# Patient Record
Sex: Female | Born: 1975 | Race: White | Hispanic: No | Marital: Single | Smoking: Never smoker
Health system: Southern US, Community
[De-identification: ages and names within clinical notes are randomized; demographics above are authoritative.]

## PROBLEM LIST (undated history)

## (undated) DIAGNOSIS — G43909 Migraine, unspecified, not intractable, without status migrainosus: Secondary | ICD-10-CM

## (undated) DIAGNOSIS — T8859XA Other complications of anesthesia, initial encounter: Secondary | ICD-10-CM

## (undated) DIAGNOSIS — T4145XA Adverse effect of unspecified anesthetic, initial encounter: Secondary | ICD-10-CM

## (undated) DIAGNOSIS — S62309A Unspecified fracture of unspecified metacarpal bone, initial encounter for closed fracture: Secondary | ICD-10-CM

## (undated) DIAGNOSIS — J45909 Unspecified asthma, uncomplicated: Secondary | ICD-10-CM

## (undated) DIAGNOSIS — Z8541 Personal history of malignant neoplasm of cervix uteri: Secondary | ICD-10-CM

## (undated) DIAGNOSIS — M1990 Unspecified osteoarthritis, unspecified site: Secondary | ICD-10-CM

## (undated) DIAGNOSIS — M26629 Arthralgia of temporomandibular joint, unspecified side: Secondary | ICD-10-CM

## (undated) DIAGNOSIS — F419 Anxiety disorder, unspecified: Secondary | ICD-10-CM

## (undated) HISTORY — PX: CERVICAL CONE BIOPSY: SUR198

## (undated) HISTORY — PX: DILATION AND CURETTAGE OF UTERUS: SHX78

## (undated) HISTORY — PX: DIAGNOSTIC LAPAROSCOPY: SUR761

## (undated) HISTORY — PX: OVARIAN CYST REMOVAL: SHX89

## (undated) HISTORY — PX: WISDOM TOOTH EXTRACTION: SHX21

---

## 1997-02-28 ENCOUNTER — Inpatient Hospital Stay (HOSPITAL_COMMUNITY): Admission: AD | Admit: 1997-02-28 | Discharge: 1997-02-28 | Payer: Self-pay | Admitting: *Deleted

## 1997-09-17 ENCOUNTER — Inpatient Hospital Stay (HOSPITAL_COMMUNITY): Admission: AD | Admit: 1997-09-17 | Discharge: 1997-09-19 | Payer: Self-pay | Admitting: *Deleted

## 2001-10-19 ENCOUNTER — Emergency Department (HOSPITAL_COMMUNITY): Admission: EM | Admit: 2001-10-19 | Discharge: 2001-10-19 | Payer: Self-pay | Admitting: Emergency Medicine

## 2001-10-19 ENCOUNTER — Encounter: Payer: Self-pay | Admitting: Emergency Medicine

## 2003-10-23 ENCOUNTER — Other Ambulatory Visit: Admission: RE | Admit: 2003-10-23 | Discharge: 2003-10-23 | Payer: Self-pay | Admitting: Obstetrics and Gynecology

## 2003-11-24 ENCOUNTER — Inpatient Hospital Stay (HOSPITAL_COMMUNITY): Admission: AD | Admit: 2003-11-24 | Discharge: 2003-11-25 | Payer: Self-pay | Admitting: Obstetrics and Gynecology

## 2003-11-25 ENCOUNTER — Ambulatory Visit (HOSPITAL_COMMUNITY): Admission: RE | Admit: 2003-11-25 | Discharge: 2003-11-25 | Payer: Self-pay | Admitting: Obstetrics and Gynecology

## 2004-01-13 ENCOUNTER — Inpatient Hospital Stay (HOSPITAL_COMMUNITY): Admission: AD | Admit: 2004-01-13 | Discharge: 2004-01-13 | Payer: Self-pay | Admitting: Obstetrics and Gynecology

## 2004-02-17 ENCOUNTER — Inpatient Hospital Stay (HOSPITAL_COMMUNITY): Admission: AD | Admit: 2004-02-17 | Discharge: 2004-02-17 | Payer: Self-pay | Admitting: Obstetrics and Gynecology

## 2004-04-14 ENCOUNTER — Inpatient Hospital Stay (HOSPITAL_COMMUNITY): Admission: AD | Admit: 2004-04-14 | Discharge: 2004-04-14 | Payer: Self-pay | Admitting: Obstetrics and Gynecology

## 2004-04-17 ENCOUNTER — Inpatient Hospital Stay (HOSPITAL_COMMUNITY): Admission: AD | Admit: 2004-04-17 | Discharge: 2004-04-17 | Payer: Self-pay | Admitting: Obstetrics and Gynecology

## 2004-04-18 ENCOUNTER — Inpatient Hospital Stay (HOSPITAL_COMMUNITY): Admission: AD | Admit: 2004-04-18 | Discharge: 2004-04-21 | Payer: Self-pay | Admitting: Obstetrics and Gynecology

## 2004-04-19 HISTORY — PX: TUBAL LIGATION: SHX77

## 2004-11-04 ENCOUNTER — Other Ambulatory Visit: Admission: RE | Admit: 2004-11-04 | Discharge: 2004-11-04 | Payer: Self-pay | Admitting: Obstetrics and Gynecology

## 2005-11-22 ENCOUNTER — Other Ambulatory Visit: Admission: RE | Admit: 2005-11-22 | Discharge: 2005-11-22 | Payer: Self-pay | Admitting: Obstetrics and Gynecology

## 2006-02-02 ENCOUNTER — Emergency Department (HOSPITAL_COMMUNITY): Admission: EM | Admit: 2006-02-02 | Discharge: 2006-02-02 | Payer: Self-pay | Admitting: Family Medicine

## 2007-10-13 ENCOUNTER — Emergency Department (HOSPITAL_COMMUNITY): Admission: EM | Admit: 2007-10-13 | Discharge: 2007-10-13 | Payer: Self-pay | Admitting: Emergency Medicine

## 2008-06-01 ENCOUNTER — Emergency Department (HOSPITAL_COMMUNITY): Admission: EM | Admit: 2008-06-01 | Discharge: 2008-06-01 | Payer: Self-pay | Admitting: Family Medicine

## 2009-04-06 ENCOUNTER — Emergency Department (HOSPITAL_COMMUNITY)
Admission: EM | Admit: 2009-04-06 | Discharge: 2009-04-06 | Payer: Self-pay | Source: Home / Self Care | Admitting: Family Medicine

## 2010-05-21 NOTE — Op Note (Signed)
Mackenzie Higgins, Mackenzie Higgins               ACCOUNT NO.:  [id number]   MEDICAL RECORD NO.:  [id number]          PATIENT TYPE:  INP   LOCATION:  9147                          FACILITY:  [facility]   PHYSICIAN:  Crist Fat. Rivard, M.D. DATE OF BIRTH:  1975/05/13   DATE OF PROCEDURE:  04/19/2004  DATE OF DISCHARGE:                                 OPERATIVE REPORT   PREOPERATIVE DIAGNOSIS:  Desire for sterilization.   POSTOPERATIVE DIAGNOSIS:  Desire for sterilization.   ANESTHESIA:  Epidural.   PROCEDURE:  Postpartum bilateral tubal ligation.   SURGEON:  Crist Fat. Rivard, M.D.   ESTIMATED BLOOD LOSS:  Minimal.   PROCEDURE:  After being informed of the planned procedure with possible  complications including bleeding, infection, injury to other organs,  irreversibility of tubal ligation as well as failure rate of one in 1000,  informed consent was obtained.  The patient is taken to OR #4 and pre-  existing epidural is reinforced.  She is placed in a dorsal decubitus  position, prepped and draped in a sterile fashion.  After assessing adequate  level of anesthesia, the umbilical area is infiltrated with 10 mL of  Marcaine 0.25% and we perform a semi-elliptical incision with knife, which  is brought down to the fascia.  The fascia is grasped with forceps and  incised with scissors.  The peritoneum is entered bluntly.  We are then able  to easily locate each tube using Babcock forceps.  We exteriorize the tube  until the fimbriae are well-visualized.  The in the isthmic-ampullary  region, we open the mesosalpinx with cautery and we doubly ligate the  proximal end of the distal stump using 0 chromic and remove a section of 1.5  cm of tube.  Both stumps are then cauterized.  Hemostasis is adequate.  The  fascia is closed with a running suture of 0 Vicryl and the skin is closed  with a subcuticular suture of 4-0 Monocryl and Steri-Strips.   Instrument and sponge count is complete x2.  Estimated blood  loss is normal.  The procedure is very well-tolerated by the patient, who is taken to the  recovery room in a well and stable condition.      SAR/MEDQ  D:  04/19/2004  T:  04/19/2004  Job:  [id number]
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
## 2010-05-21 NOTE — Discharge Summary (Signed)
NAME:  Mackenzie Higgins, Mackenzie Higgins               ACCOUNT NO.:  [id number]   MEDICAL RECORD NO.:  [id number]          PATIENT TYPE:  INP   LOCATION:  9147                          [facility]ILITY:  [facility]   PHYSICIAN:  Naima A. Dillard, M.D. DATE OF BIRTH:  13-Jan-1975   DATE OF ADMISSION:  04/18/2004  DATE OF DISCHARGE:  04/21/2004                                 DISCHARGE SUMMARY   ADMISSION DIAGNOSES:  Intra-uterine pregnancy at term, premature rupture of  membranes in early labor, desires sterilization.   DISCHARGE DIAGNOSES:  Intra-uterine pregnancy at term, delivered, normal  spontaneous vaginal delivery, postpartum bilateral tubal ligation.   Ms. Coltrane is a 35 year old gravida 4, para 1-1-1-2 who presented at [redacted]  weeks gestation following spontaneous rupture of membranes at home in early  labor.  Labor progressed normally.  Pitocin was used for augmentation of  labor and the patient progressed to completely dilated and delivered a  viable female infant named Trae with Apgar scores of 9 at one minute and 9 at  five minutes.  Both patient and infant have done well in the postpartum  period.  The patient's vital signs have been stable.  She is afebrile on the  first postpartum day. Her hemoglobin was 11.0.  On the first postpartum day,  she underwent postpartum bilateral tubal sterilization by Dr. Dois Davenport Rivard  and the patient has done well in the postoperative period.  Her umbilical  incision is clean, dry and intact and on this, her second postpartum day,  she is judged to be in satisfactory condition for discharge.   DISCHARGE INSTRUCTIONS:  Per [facility] handout.   DISCHARGE MEDICATIONS:  1.  Motrin 600 mg p.o. q.6h. p.r.n. pain.  2.  Tylox one to two p.o. q.3-4h. pain.  3.  Prenatal vitamins.   DISCHARGE FOLLOWUP:  Will be at [facility] in six weeks.      SDM/MEDQ  D:  04/21/2004  T:  04/21/2004  Job:  [id number]

## 2011-05-26 ENCOUNTER — Encounter (HOSPITAL_BASED_OUTPATIENT_CLINIC_OR_DEPARTMENT_OTHER): Payer: Self-pay | Admitting: Family Medicine

## 2011-05-26 ENCOUNTER — Emergency Department (HOSPITAL_BASED_OUTPATIENT_CLINIC_OR_DEPARTMENT_OTHER): Payer: Self-pay

## 2011-05-26 ENCOUNTER — Emergency Department (HOSPITAL_BASED_OUTPATIENT_CLINIC_OR_DEPARTMENT_OTHER)
Admission: EM | Admit: 2011-05-26 | Discharge: 2011-05-26 | Disposition: A | Discharge status: Home / Self Care | Payer: Self-pay | Attending: Emergency Medicine | Admitting: Emergency Medicine

## 2011-05-26 DIAGNOSIS — R0982 Postnasal drip: Secondary | ICD-10-CM | POA: Insufficient documentation

## 2011-05-26 DIAGNOSIS — J3489 Other specified disorders of nose and nasal sinuses: Secondary | ICD-10-CM | POA: Insufficient documentation

## 2011-05-26 DIAGNOSIS — J069 Acute upper respiratory infection, unspecified: Secondary | ICD-10-CM | POA: Insufficient documentation

## 2011-05-26 DIAGNOSIS — M1990 Unspecified osteoarthritis, unspecified site: Secondary | ICD-10-CM

## 2011-05-26 DIAGNOSIS — M129 Arthropathy, unspecified: Secondary | ICD-10-CM | POA: Insufficient documentation

## 2011-05-26 DIAGNOSIS — R05 Cough: Secondary | ICD-10-CM | POA: Insufficient documentation

## 2011-05-26 DIAGNOSIS — R059 Cough, unspecified: Secondary | ICD-10-CM | POA: Insufficient documentation

## 2011-05-26 DIAGNOSIS — M549 Dorsalgia, unspecified: Secondary | ICD-10-CM | POA: Insufficient documentation

## 2011-05-26 DIAGNOSIS — M25559 Pain in unspecified hip: Secondary | ICD-10-CM | POA: Insufficient documentation

## 2011-05-26 HISTORY — DX: Anxiety disorder, unspecified: F41.9

## 2011-05-26 MED ORDER — CELECOXIB 100 MG PO CAPS: 100.0000 mg | ORAL_CAPSULE | Freq: Two times a day (BID) | ORAL | Status: AC
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
## 2011-05-26 NOTE — ED Notes (Signed)
Patient demanded to see EDP prior to discharge stating she needs a Zpack to treat her sinus infection.  Became very argumentative with the explanation of not using antibiotics, demanding to see the doctor.  Update given to Dr. Fredderick Phenix, patient followed nurse to nursing station and became angry about not getting antibiotics, walked back into her room and slammed her door.  Dr. Fredderick Phenix in to discuss reason for not prescribing antibiotic.  Patient requested copy of her xray.  Information given about [employer] in [place], [place].  Verbalized understanding.

## 2011-05-26 NOTE — Discharge Instructions (Signed)
Arthritis, Nonspecific Arthritis is pain, redness, warmth, or puffiness (swelling) of a joint. The joint may be stiff or hurt when you move it. One or more joints may be affected. There are many types of arthritis. Your doctor may not know what type you have right away. The most common cause of arthritis is wear and tear on the joint (osteoarthritis). HOME CARE   Only take medicine as told by your doctor.   Rest the joint as much as possible.   Raise (elevate) your joint if it is puffy.   Use crutches if the painful joint is in your leg.   Drink enough water and fluids to keep your pee (urine) clear or pale yellow.   Follow your doctor's instructions for diet.   Use cold packs for very bad joint pain for 10 to 15 minutes every hour. Ask your doctor if it is okay for you to use hot packs.   Exercise as told by your doctor.   Take a warm shower if you have stiffness in the morning.   Move your sore joints throughout the day.  GET HELP RIGHT AWAY IF:   You do not feel better in 24 hours or are getting worse.   You are having side effects from your medicine.   You are not getting better with treatment.   You have a fever.   You have very bad joint pain, puffiness, or redness.   Many joints become painful and puffy.   You have very bad back pain or leg weakness.   You cannot control when you poop (bowel movement) or pee (urinate).  MAKE SURE YOU:   Understand these instructions.   Will watch your condition.   Will get help right away if you are not doing well or get worse.  Document Released: 03/16/2009 Document Revised: 12/09/2010 Document Reviewed: 03/16/2009 [facility] Patient Information 2012 [place], [place].Upper Respiratory Infection, Adult An upper respiratory infection (URI) is also known as the common cold. It is often caused by a type of germ (virus). Colds are easily spread (contagious). You can pass it to others by kissing, coughing, sneezing, or drinking out of  the same glass. Usually, you get better in 1 or 2 weeks.  HOME CARE   Only take medicine as told by your doctor.   Use a warm mist humidifier or breathe in steam from a hot shower.   Drink enough water and fluids to keep your pee (urine) clear or pale yellow.   Get plenty of rest.   Return to work when your temperature is back to normal or as told by your doctor. You may use a face mask and wash your hands to stop your cold from spreading.  GET HELP RIGHT AWAY IF:   After the first few days, you feel you are getting worse.   You have questions about your medicine.   You have chills, shortness of breath, or brown or red spit (mucus).   You have yellow or brown snot (nasal discharge) or pain in the face, especially when you bend forward.   You have a fever, puffy (swollen) neck, pain when you swallow, or white spots in the back of your throat.   You have a bad headache, ear pain, sinus pain, or chest pain.   You have a high-pitched whistling sound when you breathe in and out (wheezing).   You have a lasting cough or cough up blood.   You have sore muscles or a stiff neck.  MAKE  SURE YOU:   Understand these instructions.   Will watch your condition.   Will get help right away if you are not doing well or get worse.  Document Released: 06/08/2007 Document Revised: 12/09/2010 Document Reviewed: 04/26/2010 [facility] Patient Information 2012 [place], [place].
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
## 2011-09-23 ENCOUNTER — Telehealth: Payer: Self-pay | Admitting: Obstetrics and Gynecology

## 2011-09-23 NOTE — Telephone Encounter (Signed)
Tc to pt per telephone call. Pt c/o vaginal d/c with odor. No UTI sx's and no dyspareunia. Appt sched 09/26/11 with avs. Pt reminded AEX due 09/14/11. Pt will sched at later time due to financial reasons. Pt voices understanding.

## 2011-09-26 ENCOUNTER — Ambulatory Visit: Payer: Self-pay | Admitting: Obstetrics and Gynecology

## 2011-10-03 ENCOUNTER — Encounter: Payer: Self-pay | Admitting: Obstetrics and Gynecology

## 2011-10-12 ENCOUNTER — Encounter: Payer: Self-pay | Admitting: Obstetrics and Gynecology

## 2011-11-22 ENCOUNTER — Encounter: Payer: Self-pay | Admitting: Obstetrics and Gynecology

## 2012-11-18 ENCOUNTER — Encounter (HOSPITAL_COMMUNITY): Payer: Self-pay | Admitting: Emergency Medicine

## 2012-11-18 ENCOUNTER — Emergency Department (HOSPITAL_COMMUNITY): Payer: Medicaid Other

## 2012-11-18 ENCOUNTER — Emergency Department (HOSPITAL_COMMUNITY)
Admission: EM | Admit: 2012-11-18 | Discharge: 2012-11-19 | Disposition: A | Discharge status: Home / Self Care | Payer: Medicaid Other | Attending: Emergency Medicine | Admitting: Emergency Medicine

## 2012-11-18 DIAGNOSIS — Z8742 Personal history of other diseases of the female genital tract: Secondary | ICD-10-CM | POA: Insufficient documentation

## 2012-11-18 DIAGNOSIS — Z872 Personal history of diseases of the skin and subcutaneous tissue: Secondary | ICD-10-CM | POA: Insufficient documentation

## 2012-11-18 DIAGNOSIS — F411 Generalized anxiety disorder: Secondary | ICD-10-CM | POA: Insufficient documentation

## 2012-11-18 DIAGNOSIS — S62309A Unspecified fracture of unspecified metacarpal bone, initial encounter for closed fracture: Secondary | ICD-10-CM

## 2012-11-18 DIAGNOSIS — S62339A Displaced fracture of neck of unspecified metacarpal bone, initial encounter for closed fracture: Secondary | ICD-10-CM | POA: Insufficient documentation

## 2012-11-18 DIAGNOSIS — IMO0002 Reserved for concepts with insufficient information to code with codable children: Secondary | ICD-10-CM | POA: Insufficient documentation

## 2012-11-18 DIAGNOSIS — Z8541 Personal history of malignant neoplasm of cervix uteri: Secondary | ICD-10-CM | POA: Insufficient documentation

## 2012-11-18 HISTORY — DX: Unspecified fracture of unspecified metacarpal bone, initial encounter for closed fracture: S62.309A

## 2012-11-18 MED ORDER — LORAZEPAM 1 MG PO TABS
1.0000 mg | ORAL_TABLET | Freq: Once | ORAL | Status: AC
  Administered 2012-11-19: 1 mg via ORAL
  Filled 2012-11-18: qty 2

## 2012-11-18 MED ORDER — OXYCODONE-ACETAMINOPHEN 5-325 MG PO TABS
2.0000 | ORAL_TABLET | Freq: Once | ORAL | Status: AC
  Administered 2012-11-18: 2 via ORAL
  Filled 2012-11-18: qty 2
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
## 2012-11-19 MED ORDER — ONDANSETRON 4 MG PO TBDP
4.0000 mg | ORAL_TABLET | Freq: Once | ORAL | Status: AC
  Administered 2012-11-19: 4 mg via ORAL
  Filled 2012-11-19: qty 1

## 2012-11-19 MED ORDER — OXYCODONE-ACETAMINOPHEN 5-325 MG PO TABS: 1.0000 | ORAL_TABLET | Freq: Four times a day (QID) | ORAL | Status: AC | PRN

## 2012-11-19 NOTE — Progress Notes (Signed)
Orthopedic Tech Progress Note Patient Details:  Mackenzie Higgins 1975/06/28 [id number]  Ortho Devices Type of Ortho Device: Volar splint;Ace wrap Ortho Device/Splint Interventions: Application   Cammer, Mickie Bail 11/19/2012, 12:52 AM

## 2012-11-19 NOTE — ED Provider Notes (Signed)
Medical screening examination/treatment/procedure(s) were performed by non-physician practitioner and as supervising physician I was immediately available for consultation/collaboration.  EKG Interpretation   None        Sunnie Nielsen, MD 11/19/12 [phone]

## 2012-11-20 ENCOUNTER — Other Ambulatory Visit: Payer: Self-pay | Admitting: Orthopedic Surgery

## 2012-11-20 ENCOUNTER — Encounter (HOSPITAL_BASED_OUTPATIENT_CLINIC_OR_DEPARTMENT_OTHER): Payer: Self-pay | Admitting: *Deleted

## 2012-11-21 ENCOUNTER — Encounter (HOSPITAL_BASED_OUTPATIENT_CLINIC_OR_DEPARTMENT_OTHER): Payer: Medicaid Other | Admitting: Anesthesiology

## 2012-11-21 ENCOUNTER — Ambulatory Visit (HOSPITAL_BASED_OUTPATIENT_CLINIC_OR_DEPARTMENT_OTHER): Payer: Medicaid Other | Admitting: Anesthesiology

## 2012-11-21 ENCOUNTER — Encounter (HOSPITAL_BASED_OUTPATIENT_CLINIC_OR_DEPARTMENT_OTHER)
Admission: RE | Disposition: A | Discharge status: Home / Self Care | Payer: Self-pay | Source: Ambulatory Visit | Attending: Orthopedic Surgery

## 2012-11-21 ENCOUNTER — Ambulatory Visit (HOSPITAL_BASED_OUTPATIENT_CLINIC_OR_DEPARTMENT_OTHER)
Admission: RE | Admit: 2012-11-21 | Discharge: 2012-11-21 | Disposition: A | Discharge status: Home / Self Care | Payer: Medicaid Other | Source: Ambulatory Visit | Attending: Orthopedic Surgery | Admitting: Orthopedic Surgery

## 2012-11-21 ENCOUNTER — Encounter (HOSPITAL_BASED_OUTPATIENT_CLINIC_OR_DEPARTMENT_OTHER): Payer: Self-pay | Admitting: *Deleted

## 2012-11-21 DIAGNOSIS — Z8541 Personal history of malignant neoplasm of cervix uteri: Secondary | ICD-10-CM | POA: Insufficient documentation

## 2012-11-21 DIAGNOSIS — S62339A Displaced fracture of neck of unspecified metacarpal bone, initial encounter for closed fracture: Secondary | ICD-10-CM | POA: Insufficient documentation

## 2012-11-21 DIAGNOSIS — S62309A Unspecified fracture of unspecified metacarpal bone, initial encounter for closed fracture: Secondary | ICD-10-CM

## 2012-11-21 DIAGNOSIS — W19XXXA Unspecified fall, initial encounter: Secondary | ICD-10-CM | POA: Insufficient documentation

## 2012-11-21 HISTORY — DX: Unspecified asthma, uncomplicated: J45.909

## 2012-11-21 HISTORY — DX: Adverse effect of unspecified anesthetic, initial encounter: T41.45XA

## 2012-11-21 HISTORY — DX: Migraine, unspecified, not intractable, without status migrainosus: G43.909

## 2012-11-21 HISTORY — PX: OPEN REDUCTION INTERNAL FIXATION (ORIF) METACARPAL: SHX6234

## 2012-11-21 HISTORY — DX: Personal history of malignant neoplasm of cervix uteri: Z85.41

## 2012-11-21 HISTORY — DX: Arthralgia of temporomandibular joint, unspecified side: M26.629

## 2012-11-21 HISTORY — DX: Other complications of anesthesia, initial encounter: T88.59XA

## 2012-11-21 HISTORY — DX: Unspecified fracture of unspecified metacarpal bone, initial encounter for closed fracture: S62.309A

## 2012-11-21 HISTORY — DX: Unspecified osteoarthritis, unspecified site: M19.90

## 2012-11-21 LAB — POCT HEMOGLOBIN-HEMACUE: Hemoglobin: 13.7 g/dL (ref 12.0–15.0)

## 2012-11-21 SURGERY — OPEN REDUCTION INTERNAL FIXATION (ORIF) METACARPAL
Anesthesia: Regional | Site: Finger | Laterality: Right | Wound class: Clean

## 2012-11-21 MED ORDER — DEXAMETHASONE SODIUM PHOSPHATE 4 MG/ML IJ SOLN
INTRAMUSCULAR | Status: DC | PRN
  Administered 2012-11-21: 10 mg via INTRAVENOUS

## 2012-11-21 MED ORDER — OXYCODONE-ACETAMINOPHEN 5-325 MG PO TABS: 1.0000 | ORAL_TABLET | ORAL | Status: AC | PRN

## 2012-11-21 MED ORDER — FENTANYL CITRATE 0.05 MG/ML IJ SOLN
INTRAMUSCULAR | Status: AC
  Filled 2012-11-21: qty 4

## 2012-11-21 MED ORDER — BUPIVACAINE HCL (PF) 0.5 % IJ SOLN
INTRAMUSCULAR | Status: DC | PRN
  Administered 2012-11-21: 5 mL via PERINEURAL

## 2012-11-21 MED ORDER — BUPIVACAINE-EPINEPHRINE PF 0.5-1:200000 % IJ SOLN
INTRAMUSCULAR | Status: DC | PRN
  Administered 2012-11-21: 30 mL via PERINEURAL

## 2012-11-21 MED ORDER — PROPOFOL 10 MG/ML IV BOLUS
INTRAVENOUS | Status: DC | PRN
  Administered 2012-11-21: 150 mg via INTRAVENOUS

## 2012-11-21 MED ORDER — OXYCODONE HCL 5 MG PO TABS
5.0000 mg | ORAL_TABLET | Freq: Once | ORAL | Status: AC | PRN
  Administered 2012-11-21: 5 mg via ORAL

## 2012-11-21 MED ORDER — BUPIVACAINE HCL (PF) 0.25 % IJ SOLN
INTRAMUSCULAR | Status: AC
  Filled 2012-11-21: qty 30

## 2012-11-21 MED ORDER — OXYCODONE HCL 5 MG/5ML PO SOLN: 5.0000 mg | Freq: Once | ORAL | Status: AC | PRN

## 2012-11-21 MED ORDER — LACTATED RINGERS IV SOLN
INTRAVENOUS | Status: DC
  Administered 2012-11-21 (×2): via INTRAVENOUS

## 2012-11-21 MED ORDER — HYDROMORPHONE HCL PF 1 MG/ML IJ SOLN
0.2500 mg | INTRAMUSCULAR | Status: DC | PRN
  Administered 2012-11-21: 0.5 mg via INTRAVENOUS

## 2012-11-21 MED ORDER — OXYCODONE HCL 5 MG PO TABS
ORAL_TABLET | ORAL | Status: AC
  Filled 2012-11-21: qty 1

## 2012-11-21 MED ORDER — FENTANYL CITRATE 0.05 MG/ML IJ SOLN
50.0000 ug | INTRAMUSCULAR | Status: DC | PRN
  Administered 2012-11-21: 100 ug via INTRAVENOUS

## 2012-11-21 MED ORDER — MIDAZOLAM HCL 2 MG/2ML IJ SOLN
1.0000 mg | INTRAMUSCULAR | Status: DC | PRN
  Administered 2012-11-21: 2 mg via INTRAVENOUS

## 2012-11-21 MED ORDER — MIDAZOLAM HCL 2 MG/2ML IJ SOLN
INTRAMUSCULAR | Status: AC
  Filled 2012-11-21: qty 2

## 2012-11-21 MED ORDER — CEFAZOLIN SODIUM 1-5 GM-% IV SOLN
INTRAVENOUS | Status: AC
  Filled 2012-11-21: qty 100

## 2012-11-21 MED ORDER — LIDOCAINE HCL (CARDIAC) 20 MG/ML IV SOLN
INTRAVENOUS | Status: DC | PRN
  Administered 2012-11-21: 40 mg via INTRAVENOUS

## 2012-11-21 MED ORDER — ONDANSETRON HCL 4 MG/2ML IJ SOLN
INTRAMUSCULAR | Status: DC | PRN
  Administered 2012-11-21: 4 mg via INTRAVENOUS

## 2012-11-21 MED ORDER — CHLORHEXIDINE GLUCONATE 4 % EX LIQD: 60.0000 mL | Freq: Once | CUTANEOUS | Status: DC

## 2012-11-21 MED ORDER — CEFAZOLIN SODIUM-DEXTROSE 2-3 GM-% IV SOLR
2.0000 g | INTRAVENOUS | Status: AC
  Administered 2012-11-21: 2 g via INTRAVENOUS

## 2012-11-21 MED ORDER — HYDROMORPHONE HCL PF 1 MG/ML IJ SOLN
INTRAMUSCULAR | Status: AC
  Filled 2012-11-21: qty 1

## 2012-11-21 MED ORDER — MIDAZOLAM HCL 2 MG/ML PO SYRP: 12.0000 mg | ORAL_SOLUTION | Freq: Once | ORAL | Status: DC | PRN

## 2012-11-21 MED ORDER — FENTANYL CITRATE 0.05 MG/ML IJ SOLN
INTRAMUSCULAR | Status: AC
  Filled 2012-11-21: qty 2

## 2012-11-21 MED ORDER — SUCCINYLCHOLINE CHLORIDE 20 MG/ML IJ SOLN
INTRAMUSCULAR | Status: DC | PRN
  Administered 2012-11-21: 60 mg via INTRAVENOUS

## 2012-11-21 MED ORDER — LIDOCAINE HCL (PF) 1 % IJ SOLN
INTRAMUSCULAR | Status: AC
  Filled 2012-11-21: qty 30

## 2012-11-21 SURGICAL SUPPLY — 69 items
APL SKNCLS STERI-STRIP NONHPOA (GAUZE/BANDAGES/DRESSINGS) ×1
BANDAGE ELASTIC 3 VELCRO ST LF (GAUZE/BANDAGES/DRESSINGS) ×2 IMPLANT
BANDAGE ELASTIC 4 VELCRO ST LF (GAUZE/BANDAGES/DRESSINGS) IMPLANT
BANDAGE GAUZE ELAST BULKY 4 IN (GAUZE/BANDAGES/DRESSINGS) ×2 IMPLANT
BENZOIN TINCTURE PRP APPL 2/3 (GAUZE/BANDAGES/DRESSINGS) ×2 IMPLANT
BIT DRILL 1.0 W/MINI QC (BIT) ×2 IMPLANT
BLADE SURG 15 STRL LF DISP TIS (BLADE) ×1 IMPLANT
BLADE SURG 15 STRL SS (BLADE) ×2
BNDG CMPR 9X4 STRL LF SNTH (GAUZE/BANDAGES/DRESSINGS) ×1
BNDG CMPR MD 5X2 ELC HKLP STRL (GAUZE/BANDAGES/DRESSINGS)
BNDG ELASTIC 2 VLCR STRL LF (GAUZE/BANDAGES/DRESSINGS) IMPLANT
BNDG ESMARK 4X9 LF (GAUZE/BANDAGES/DRESSINGS) ×2 IMPLANT
CANISTER SUCT 1200ML W/VALVE (MISCELLANEOUS) IMPLANT
CORDS BIPOLAR (ELECTRODE) IMPLANT
COVER TABLE BACK 60X90 (DRAPES) ×2 IMPLANT
CUFF TOURNIQUET SINGLE 18IN (TOURNIQUET CUFF) ×2 IMPLANT
DECANTER SPIKE VIAL GLASS SM (MISCELLANEOUS) IMPLANT
DRAPE EXTREMITY T 121X128X90 (DRAPE) ×2 IMPLANT
DRAPE OEC MINIVIEW 54X84 (DRAPES) ×2 IMPLANT
DRAPE SURG 17X23 STRL (DRAPES) ×2 IMPLANT
DURAPREP 26ML APPLICATOR (WOUND CARE) ×2 IMPLANT
GAUZE SPONGE 4X4 16PLY XRAY LF (GAUZE/BANDAGES/DRESSINGS) IMPLANT
GAUZE XEROFORM 1X8 LF (GAUZE/BANDAGES/DRESSINGS) IMPLANT
GLOVE BIO SURGEON STRL SZ7 (GLOVE) ×2 IMPLANT
GLOVE BIO SURGEON STRL SZ8 (GLOVE) ×2 IMPLANT
GLOVE BIOGEL M STRL SZ7.5 (GLOVE) ×2 IMPLANT
GOWN BRE IMP PREV XXLGXLNG (GOWN DISPOSABLE) ×6 IMPLANT
GOWN PREVENTION PLUS XLARGE (GOWN DISPOSABLE) ×2 IMPLANT
K-WIRE PROS .028 4 (WIRE) ×2 IMPLANT
NEEDLE HYPO 25X1 1.5 SAFETY (NEEDLE) IMPLANT
NS IRRIG 1000ML POUR BTL (IV SOLUTION) IMPLANT
PACK BASIN DAY SURGERY FS (CUSTOM PROCEDURE TRAY) ×2 IMPLANT
PAD CAST 3X4 CTTN HI CHSV (CAST SUPPLIES) ×1 IMPLANT
PAD CAST 4YDX4 CTTN HI CHSV (CAST SUPPLIES) IMPLANT
PADDING CAST ABS 4INX4YD NS (CAST SUPPLIES) ×1
PADDING CAST ABS COTTON 4X4 ST (CAST SUPPLIES) ×1 IMPLANT
PADDING CAST COTTON 3X4 STRL (CAST SUPPLIES) ×2
PADDING CAST COTTON 4X4 STRL (CAST SUPPLIES)
PADDING UNDERCAST 2  STERILE (CAST SUPPLIES) ×2 IMPLANT
SCREW 1.3X11MM (Screw) ×2 IMPLANT
SCREW BN 11X1.3XNONLOCK HND (Screw) ×1 IMPLANT
SCREW NON LOCK 1.3X14MM (Screw) ×2 IMPLANT
SCREW NON-LOCK 1.3X12 (Screw) ×2 IMPLANT
SCREW NON-LOCK 1.3X15 (Screw) ×2 IMPLANT
SHEET MEDIUM DRAPE 40X70 STRL (DRAPES) ×2 IMPLANT
SPLINT PLASTER CAST XFAST 3X15 (CAST SUPPLIES) ×14 IMPLANT
SPLINT PLASTER CAST XFAST 4X15 (CAST SUPPLIES) IMPLANT
SPLINT PLASTER XTRA FAST SET 4 (CAST SUPPLIES)
SPLINT PLASTER XTRA FASTSET 3X (CAST SUPPLIES) ×14
SPONGE GAUZE 4X4 12PLY (GAUZE/BANDAGES/DRESSINGS) ×2 IMPLANT
STOCKINETTE 4X48 STRL (DRAPES) ×2 IMPLANT
STRIP CLOSURE SKIN 1/2X4 (GAUZE/BANDAGES/DRESSINGS) ×2 IMPLANT
SUCTION FRAZIER TIP 10 FR DISP (SUCTIONS) IMPLANT
SUT ETHILON 4 0 PS 2 18 (SUTURE) IMPLANT
SUT ETHILON 5 0 PS 2 18 (SUTURE) IMPLANT
SUT MERSILENE 4 0 P 3 (SUTURE) ×2 IMPLANT
SUT VIC AB 2-0 SH 27 (SUTURE) ×2
SUT VIC AB 2-0 SH 27XBRD (SUTURE) ×1 IMPLANT
SUT VIC AB 3-0 SH 27 (SUTURE) ×2
SUT VIC AB 3-0 SH 27X BRD (SUTURE) ×1 IMPLANT
SUT VIC AB 4-0 P-3 18XBRD (SUTURE) IMPLANT
SUT VIC AB 4-0 P3 18 (SUTURE)
SUT VICRYL 4-0 PS2 18IN ABS (SUTURE) ×2 IMPLANT
SUT VICRYL RAPIDE 4/0 PS 2 (SUTURE) IMPLANT
SYR BULB 3OZ (MISCELLANEOUS) ×2 IMPLANT
SYRINGE 10CC LL (SYRINGE) IMPLANT
TOWEL OR 17X24 6PK STRL BLUE (TOWEL DISPOSABLE) ×2 IMPLANT
TUBE CONNECTING 20X1/4 (TUBING) IMPLANT
UNDERPAD 30X30 INCONTINENT (UNDERPADS AND DIAPERS) ×2 IMPLANT

## 2012-11-21 NOTE — Transfer of Care (Signed)
Immediate Anesthesia Transfer of Care Note  Patient: Mackenzie Higgins  Procedure(s) Performed: Procedure(s) with comments: OPEN REDUCTION INTERNAL FIXATION (ORIF) RIGHT LONG METACARPAL (Right) - right long finger  Patient Location: PACU  Anesthesia Type:GA combined with regional for post-op pain  Level of Consciousness: awake and patient cooperative  Airway & Oxygen Therapy: Patient Spontanous Breathing and Patient connected to face mask oxygen  Post-op Assessment: Report given to PACU RN and Post -op Vital signs reviewed and stable  Post vital signs: Reviewed and stable  Complications: No apparent anesthesia complications

## 2012-11-21 NOTE — Progress Notes (Signed)
Assisted Dr. Fitzgerald with right, ultrasound guided, supraclavicular block. Side rails up, monitors on throughout procedure. See vital signs in flow sheet. Tolerated Procedure well. 

## 2012-11-21 NOTE — Anesthesia Postprocedure Evaluation (Signed)
  Anesthesia Post-op Note  Patient: Ecologist  Procedure(s) Performed: Procedure(s) with comments: OPEN REDUCTION INTERNAL FIXATION (ORIF) RIGHT LONG METACARPAL (Right) - right long finger  Patient Location: PACU  Anesthesia Type:General and block  Level of Consciousness: awake and alert   Airway and Oxygen Therapy: Patient Spontanous Breathing  Post-op Pain: none  Post-op Assessment: Post-op Vital signs reviewed, Patient's Cardiovascular Status Stable and Respiratory Function Stable  Post-op Vital Signs: Reviewed  Filed Vitals:   11/21/12 1145  BP:   Pulse: 86  Temp:   Resp: 14    Complications: No apparent anesthesia complications

## 2012-11-21 NOTE — Anesthesia Procedure Notes (Addendum)
Anesthesia Regional Block:  Supraclavicular block  Pre-Anesthetic Checklist: ,, timeout performed, Correct Patient, Correct Site, Correct Laterality, Correct Procedure, Correct Position, site marked, Risks and benefits discussed, pre-op evaluation, post-op pain management  Laterality: Right  Prep: Maximum Sterile Barrier Precautions used and chloraprep       Needles:  Injection technique: Single-shot  Needle Type: Echogenic Stimulator Needle     Needle Length: 5cm 5 cm Needle Gauge: 22 and 22 G    Additional Needles:  Procedures: ultrasound guided (picture in chart) Supraclavicular block Narrative:  Start time: 11/21/2012 9:07 AM End time: 11/21/2012 9:16 AM Injection made incrementally with aspirations every 5 mL. Anesthesiologist: Fitzgerald,MD  Additional Notes: 2% Lidocaine skin wheel.   Supraclavicular block Procedure Name: Intubation Date/Time: 11/21/2012 9:57 AM Performed by: Gar Gibbon Pre-anesthesia Checklist: Patient identified, Emergency Drugs available, Suction available and Patient being monitored Patient Re-evaluated:Patient Re-evaluated prior to inductionOxygen Delivery Method: Circle System Utilized Preoxygenation: Pre-oxygenation with 100% oxygen Intubation Type: IV induction, Rapid sequence and Cricoid Pressure applied Ventilation: Mask ventilation without difficulty Laryngoscope Size: Mac and 3 Grade View: Grade II Tube type: Oral Tube size: 7.0 mm Number of attempts: 1 Airway Equipment and Method: stylet and oral airway Placement Confirmation: ETT inserted through vocal cords under direct vision,  positive ETCO2 and breath sounds checked- equal and bilateral Secured at: 21 cm Tube secured with: Tape Dental Injury: Teeth and Oropharynx as per pre-operative assessment

## 2012-11-21 NOTE — Anesthesia Preprocedure Evaluation (Signed)
Anesthesia Evaluation  Patient identified by MRN, date of birth, ID band Patient awake    Reviewed: Allergy & Precautions, H&P , NPO status , Patient's Chart, lab work & pertinent test results  Airway Mallampati: II TM Distance: >3 FB Neck ROM: Full    Dental no notable dental hx. (+) Teeth Intact and Dental Advisory Given   Pulmonary asthma ,  breath sounds clear to auscultation  Pulmonary exam normal       Cardiovascular negative cardio ROS  Rhythm:Regular Rate:Normal     Neuro/Psych  Headaches, Anxiety negative psych ROS   GI/Hepatic negative GI ROS, Neg liver ROS,   Endo/Other  negative endocrine ROS  Renal/GU negative Renal ROS  negative genitourinary   Musculoskeletal   Abdominal   Peds  Hematology negative hematology ROS (+)   Anesthesia Other Findings   Reproductive/Obstetrics negative OB ROS                           Anesthesia Physical Anesthesia Plan  ASA: II  Anesthesia Plan: General and Regional   Post-op Pain Management:    Induction: Intravenous  Airway Management Planned: Oral ETT  Additional Equipment:   Intra-op Plan:   Post-operative Plan: Extubation in OR  Informed Consent: I have reviewed the patients History and Physical, chart, labs and discussed the procedure including the risks, benefits and alternatives for the proposed anesthesia with the patient or authorized representative who has indicated his/her understanding and acceptance.   Dental advisory given  Plan Discussed with: CRNA and Surgeon  Anesthesia Plan Comments:         Anesthesia Quick Evaluation

## 2012-11-21 NOTE — H&P (Signed)
Mackenzie Higgins is an 37 y.o. female.   Chief Complaint: right hand pain and deformity HPI: as above s/p right hand trauma with displaced right long metacarpal fracture  Past Medical History  Diagnosis Date  . Anxiety   . Metacarpal bone fracture 11/18/2012    right long  . Migraines   . Arthritis     knees, hips  . Asthmatic bronchitis     history of - prn inhaler  . History of cervical cancer   . Complication of anesthesia     hypotension with epidural anesthesia  . TMJ syndrome     Past Surgical History  Procedure Laterality Date  . Dilation and curettage of uterus    . Wisdom tooth extraction    . Tubal ligation  04/19/2004  . Cervical cone biopsy    . Ovarian cyst removal    . Diagnostic laparoscopy      for cervical cancer    Family History  Problem Relation Age of Onset  . Diabetes Maternal Aunt   . Diabetes Maternal Grandfather    Social History:  reports that she has never smoked. She has never used smokeless tobacco. She reports that she drinks alcohol. She reports that she does not use illicit drugs.  Allergies: No Known Allergies  No prescriptions prior to admission    No results found for this or any previous visit (from the past 48 hour(s)). No results found.  Review of Systems  All other systems reviewed and are negative.    Height 5\' 3"  (1.6 m), weight 61.236 kg (135 lb), last menstrual period 10/22/2012. Physical Exam  Constitutional: She is oriented to person, place, and time. She appears well-developed and well-nourished.  HENT:  Head: Normocephalic and atraumatic.  Cardiovascular: Normal rate.   Respiratory: Effort normal.  Musculoskeletal:       Right hand: She exhibits bony tenderness and deformity.  Displaced right long finger metacarpal fracture  Neurological: She is alert and oriented to person, place, and time.  Skin: Skin is warm.  Psychiatric: She has a normal mood and affect. Her behavior is normal. Judgment and thought content  normal.     Assessment/Plan As above  Plan ORIF  Nael Petrosyan A 11/21/2012, 8:19 AM

## 2012-11-21 NOTE — Op Note (Signed)
See note [phone]

## 2012-11-22 ENCOUNTER — Encounter (HOSPITAL_BASED_OUTPATIENT_CLINIC_OR_DEPARTMENT_OTHER): Payer: Self-pay | Admitting: Orthopedic Surgery

## 2012-11-22 NOTE — Op Note (Signed)
NAME:  Mackenzie Higgins, Mackenzie Higgins               ACCOUNT NO.:  [id number]  MEDICAL RECORD NO.:  [id number]  LOCATION:                               FACILITY:  [facility]  PHYSICIAN:  Finnian Husted A. Trysta Showman, M.D.DATE OF BIRTH:  06/01/1975  DATE OF PROCEDURE:  11/21/2012 DATE OF DISCHARGE:  11/21/2012                              OPERATIVE REPORT   PREOPERATIVE DIAGNOSIS:  Displaced intra-articular fracture, right long metacarpal.  POSTOPERATIVE DIAGNOSIS:  Displaced intra-articular fracture, right long metacarpal.  PROCEDURE:  Open reduction and internal fixation above using four 1.3 mm lag screws.  SURGEON:  Zandria Woldt A. Lasya Vetter, MD  ASSISTANT:  Robert Dasnoit, PA-C  ANESTHESIA:  General.  COMPLICATIONS:  No complication.  DRAINS:  No drains.  DESCRIPTION OF PROCEDURE:  The patient was taken to the operating suite. After induction of adequate general anesthesia and axillary block analgesia, the right upper extremity was prepped and draped in sterile fashion.  An Esmarch was used to exsanguinate the limb.  Tourniquet was inflated to 250 mmHg.  At this point in time, a longitude incision was made over the long metacarpal.  Skin was incised 4-5 cm.  The extensor mechanism was identified.  We longitudinally split the ulnar sagittal band and a juncture between the long and ring extensors.  We retracted the extensor tendon to the radial side.  We opened the metacarpal periosteum from the distal third to the level of the head and neck junction.  The subperiosteal dissection was undertaken.  There was a complex fracture of the metacarpal distal third with intra-articular extension and a small butterfly fragment dorsal ulnarly and 2 large fragments radially.  We debrided the fracture site of clot.  Reduction clamp was then placed to the fracture site.  We fixed the large distal fragment to the proximal fragment using 2 lag screws from dorsal to volar.  We then fixed the proximal fragment to distal  fragment with a lag screw going from ulnar to radial and from proximal to distal under direct fluoroscopic guidance.  A fourth and final screw was then placed along the radial edge from dorsal volar to capture a volar fragment. Intraoperative fluoroscopy with an adequate reduction in AP, lateral and oblique view.  The wound was thoroughly irrigated.  The periosteum was closed with 2-0 undyed Vicryl.  The sagittal band and the juncture were repaired with a 4-0 Mersilene, and the skin with a 4-0 Vicryl Rapide subcuticular stitch.  Steri-Strips, 4x4s, fluffs, and a volar splint was applied.  The patient tolerated the procedure well and went to recovery room in stable fashion.     Aysa Larivee A. Kylieann Eagles, M.D.     MAW/MEDQ  D:  11/21/2012  T:  11/22/2012  Job:  [id number] 

## 2012-11-22 NOTE — Op Note (Deleted)
[name], Mackenzie Higgins NO.:  [id number]  MEDICAL RECORD NO.:  [id number]  LOCATION:                               FACILITY:  [facility]  PHYSICIAN:  Artist Pais. Liandra Mendia, M.D.DATE OF BIRTH:  11-14-1975  DATE OF PROCEDURE:  11/21/2012 DATE OF DISCHARGE:  11/21/2012                              OPERATIVE REPORT   PREOPERATIVE DIAGNOSIS:  Displaced intra-articular fracture, right long metacarpal.  POSTOPERATIVE DIAGNOSIS:  Displaced intra-articular fracture, right long metacarpal.  PROCEDURE:  Open reduction and internal fixation above using four 1.3 mm lag screws.  SURGEON:  Artist Pais. Mina Marble, MD  ASSISTANT:  Annye Rusk, PA-C  ANESTHESIA:  General.  COMPLICATIONS:  No complication.  DRAINS:  No drains.  DESCRIPTION OF PROCEDURE:  The patient was taken to the operating suite. After induction of adequate general anesthesia and axillary block analgesia, the right upper extremity was prepped and draped in sterile fashion.  An Esmarch was used to exsanguinate the limb.  Tourniquet was inflated to 250 mmHg.  At this point in time, a longitude incision was made over the long metacarpal.  Skin was incised 4-5 cm.  The extensor mechanism was identified.  We longitudinally split the ulnar sagittal band and a juncture between the long and ring extensors.  We retracted the extensor tendon to the radial side.  We opened the metacarpal periosteum from the distal third to the level of the head and neck junction.  The subperiosteal dissection was undertaken.  There was a complex fracture of the metacarpal distal third with intra-articular extension and a small butterfly fragment dorsal ulnarly and 2 large fragments radially.  We debrided the fracture site of clot.  Reduction clamp was then placed to the fracture site.  We fixed the large distal fragment to the proximal fragment using 2 lag screws from dorsal to volar.  We then fixed the proximal fragment to distal  fragment with a lag screw going from ulnar to radial and from proximal to distal under direct fluoroscopic guidance.  A fourth and final screw was then placed along the radial edge from dorsal volar to capture a volar fragment. Intraoperative fluoroscopy with an adequate reduction in AP, lateral and oblique view.  The wound was thoroughly irrigated.  The periosteum was closed with 2-0 undyed Vicryl.  The sagittal band and the juncture were repaired with a 4-0 Mersilene, and the skin with a 4-0 Vicryl Rapide subcuticular stitch.  Steri-Strips, 4x4s, fluffs, and a volar splint was applied.  The patient tolerated the procedure well and went to recovery room in stable fashion.     Artist Pais Mina Marble, M.D.     MAW/MEDQ  D:  11/21/2012  T:  11/22/2012  Job:  [id number]

## 2012-11-27 ENCOUNTER — Ambulatory Visit: Payer: Medicaid Other | Attending: Orthopedic Surgery | Admitting: Occupational Therapy

## 2012-11-27 DIAGNOSIS — M25549 Pain in joints of unspecified hand: Secondary | ICD-10-CM | POA: Insufficient documentation

## 2012-11-27 DIAGNOSIS — IMO0001 Reserved for inherently not codable concepts without codable children: Secondary | ICD-10-CM | POA: Insufficient documentation

## 2012-12-06 ENCOUNTER — Ambulatory Visit: Payer: Medicaid Other | Attending: Orthopedic Surgery | Admitting: Occupational Therapy

## 2012-12-06 DIAGNOSIS — M25549 Pain in joints of unspecified hand: Secondary | ICD-10-CM | POA: Insufficient documentation

## 2012-12-06 DIAGNOSIS — IMO0001 Reserved for inherently not codable concepts without codable children: Secondary | ICD-10-CM | POA: Insufficient documentation

## 2012-12-12 ENCOUNTER — Ambulatory Visit: Payer: Medicaid Other | Admitting: Occupational Therapy

## 2013-01-24 ENCOUNTER — Ambulatory Visit: Payer: Medicaid Other | Attending: Orthopedic Surgery | Admitting: Occupational Therapy

## 2013-01-24 DIAGNOSIS — M25549 Pain in joints of unspecified hand: Secondary | ICD-10-CM | POA: Insufficient documentation

## 2013-01-24 DIAGNOSIS — IMO0001 Reserved for inherently not codable concepts without codable children: Secondary | ICD-10-CM | POA: Insufficient documentation

## 2013-11-04 ENCOUNTER — Encounter (HOSPITAL_BASED_OUTPATIENT_CLINIC_OR_DEPARTMENT_OTHER): Payer: Self-pay | Admitting: Orthopedic Surgery

## 2015-03-12 IMAGING — CR DG HAND COMPLETE 3+V*R*
3 series · 3 of 3 positions shown · non-contrast
Comparison: Report from 11/01/2000

CLINICAL DATA: Fall.  Metacarpal pain.

EXAM:
RIGHT HAND - COMPLETE 3+ VIEW

[x hand pa right]
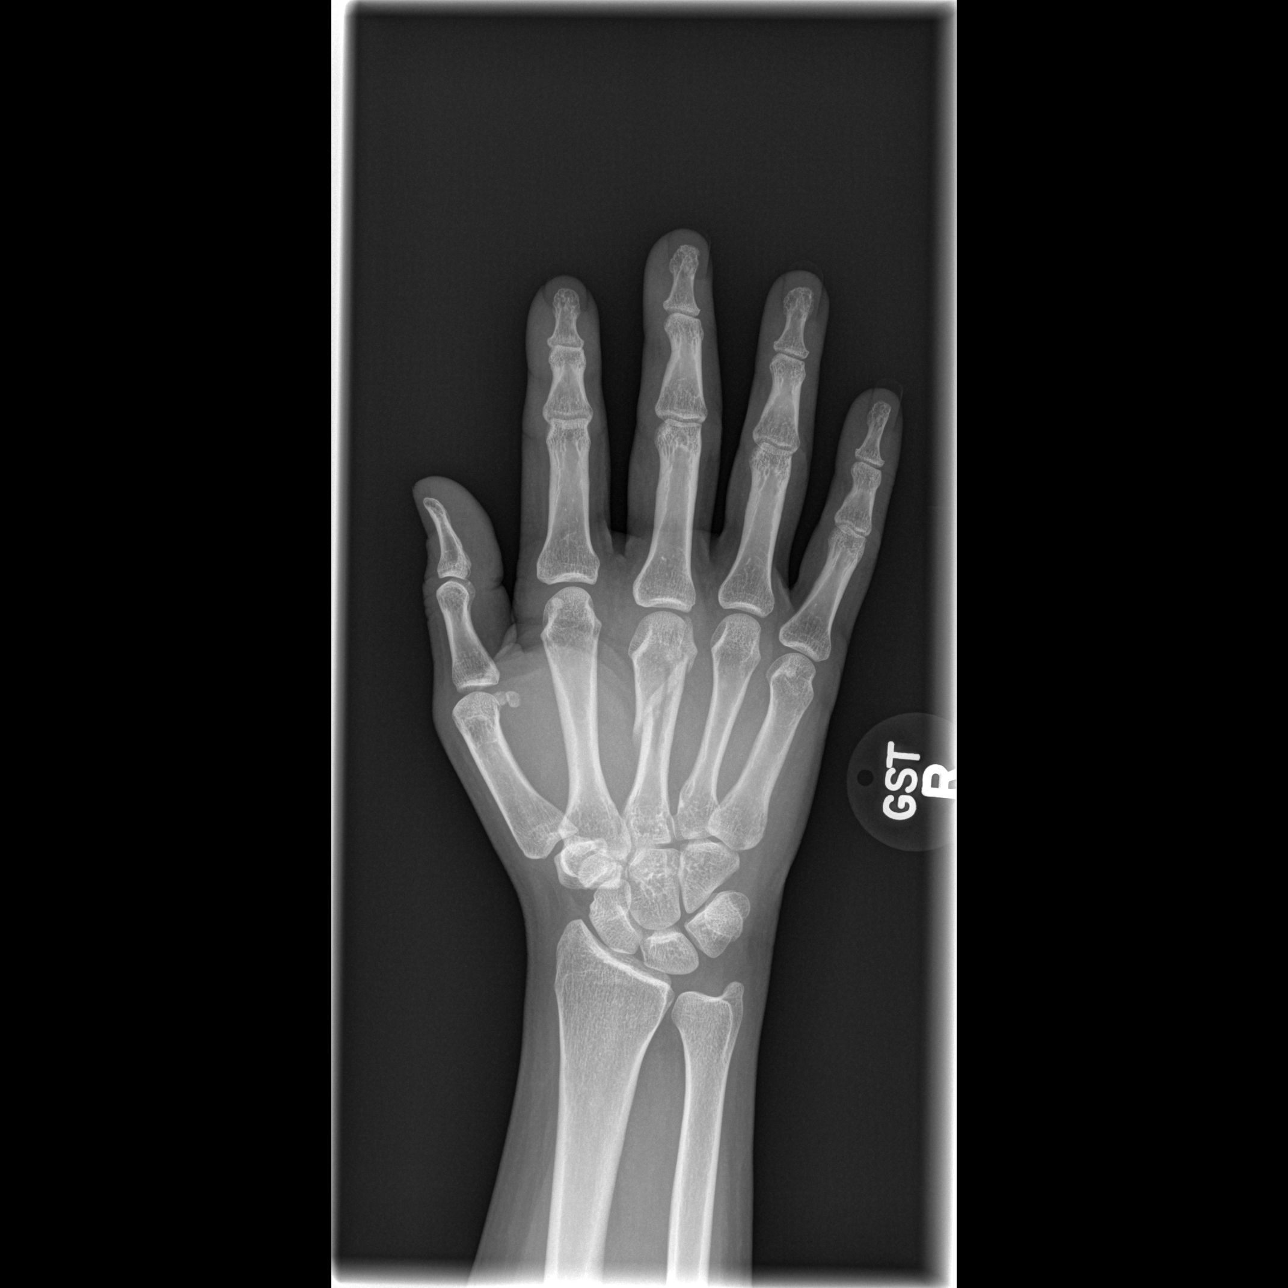

[x hand oblique right]
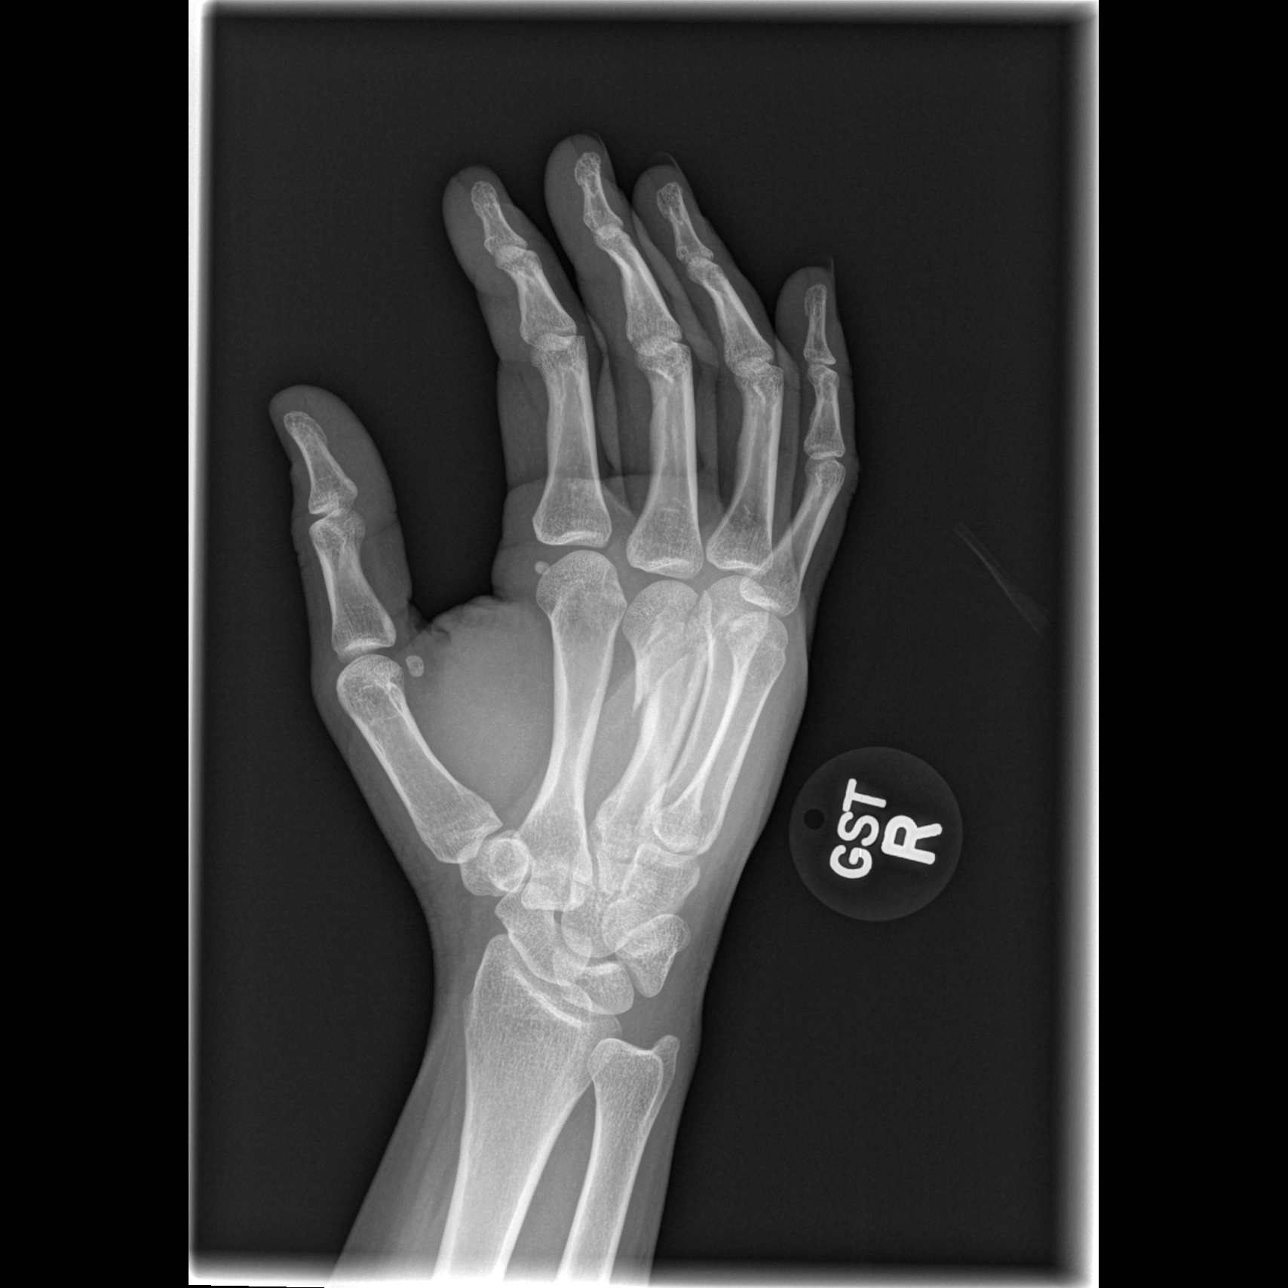

[x hand lat right *]
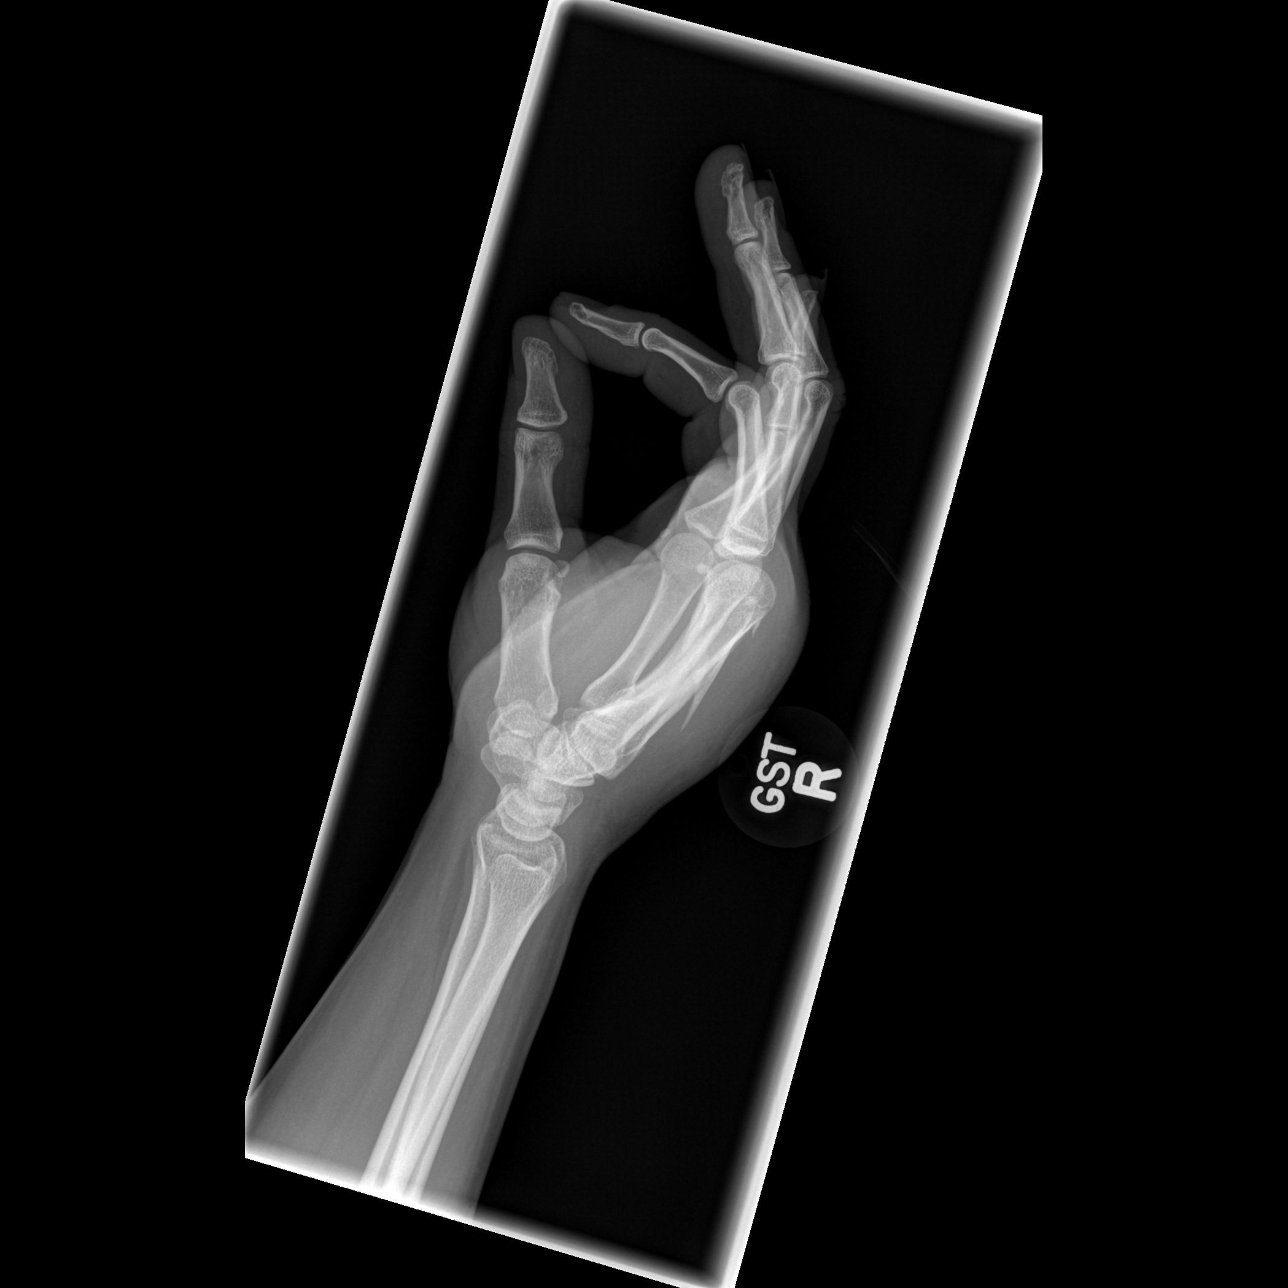

[3 of 3 positions shown; findings below may reference images not displayed]

FINDINGS: Oblique/spiral fracture of the 3rd metacarpal extends from the
midshaft to the distal articular head, with mild comminution. There
is up to 4 mm displacement of the distal fragment posteriorly on the
lateral projection, with extensive overlying soft tissue swelling in
the dorsal hand.
IMPRESSION: 1. Mildly comminuted oblique/spiral fracture of the distal half of
the 3rd metacarpal. This extends into the articular surface of the
3rd metacarpal head.

## 2019-01-09 ENCOUNTER — Other Ambulatory Visit: Payer: Self-pay

## 2019-01-09 DIAGNOSIS — Z20822 Contact with and (suspected) exposure to covid-19: Secondary | ICD-10-CM

## 2019-01-10 LAB — NOVEL CORONAVIRUS, NAA: SARS-CoV-2, NAA: NOT DETECTED
